# Patient Record
Sex: Male | Born: 1995 | Race: Black or African American | Hispanic: No | Marital: Single | Smoking: Never smoker
Health system: Southern US, Community
[De-identification: ages and names within clinical notes are randomized; demographics above are authoritative.]

## PROBLEM LIST (undated history)

## (undated) DIAGNOSIS — E669 Obesity, unspecified: Secondary | ICD-10-CM

## (undated) HISTORY — PX: KNEE SURGERY: SHX244

---

## 2018-02-27 ENCOUNTER — Other Ambulatory Visit: Payer: Self-pay

## 2018-02-27 ENCOUNTER — Ambulatory Visit (INDEPENDENT_AMBULATORY_CARE_PROVIDER_SITE_OTHER): Payer: Worker's Compensation

## 2018-02-27 ENCOUNTER — Ambulatory Visit
Admission: EM | Admit: 2018-02-27 | Discharge: 2018-02-27 | Disposition: A | Discharge status: Home / Self Care | Payer: Worker's Compensation | Attending: Emergency Medicine | Admitting: Emergency Medicine

## 2018-02-27 ENCOUNTER — Encounter: Payer: Self-pay | Admitting: Emergency Medicine

## 2018-02-27 DIAGNOSIS — X503XXA Overexertion from repetitive movements, initial encounter: Secondary | ICD-10-CM | POA: Diagnosis not present

## 2018-02-27 DIAGNOSIS — M7581 Other shoulder lesions, right shoulder: Secondary | ICD-10-CM | POA: Diagnosis not present

## 2018-02-27 DIAGNOSIS — M25511 Pain in right shoulder: Secondary | ICD-10-CM | POA: Diagnosis not present

## 2018-02-27 HISTORY — DX: Obesity, unspecified: E66.9

## 2018-02-27 MED ORDER — METHYLPREDNISOLONE 4 MG PO TBPK: ORAL_TABLET | Freq: Every day | ORAL | 0 refills | Status: DC

## 2018-02-27 MED ORDER — IBUPROFEN 600 MG PO TABS: 600.0000 mg | ORAL_TABLET | Freq: Four times a day (QID) | ORAL | 0 refills | Status: DC | PRN

## 2018-02-27 NOTE — ED Triage Notes (Signed)
Patient here today c/o right shoulder pain due to repetitive motion at work. Patient states the pain has been on going since Nov. 2019, but has gotten a lot worse since 02/16/18.

## 2018-02-27 NOTE — ED Provider Notes (Signed)
HPI  SUBJECTIVE:  Gary Mercado is a right-handed 23 y.o. male who presents with anterior/lateral right shoulder pain beginning in November 2010, states that he got worse 11 days ago.  He states it is intermittently sore, present only with forward flexion/extension and with lifting.  He denies any change in his physical activity.  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  He has had this job for the past year and a half.  He denies trauma to the shoulder, swelling, bruising, numbness or tingling distally.  No grip weakness.  No fevers.  He has tried a hand-held sander, heat, ice with improvement in his symptoms.  Symptoms are worse with forward flexion/extension and with lifting.  He does lift weights, but states that he has not increased his weight lifting recently.  Past medical history negative for diabetes, hypertension, right shoulder injury.  PMD: None.    Past Medical History:  Diagnosis Date  . Obesity     Past Surgical History:  Procedure Laterality Date  . KNEE SURGERY Right     Family History  Problem Relation Age of Onset  . Healthy Mother   . Healthy Father     Social History   Tobacco Use  . Smoking status: Never Smoker  . Smokeless tobacco: Never Used  Substance Use Topics  . Alcohol use: Yes    Comment: social  . Drug use: Never    No current facility-administered medications for this encounter.   Current Outpatient Medications:  .  ibuprofen (ADVIL,MOTRIN) 600 MG tablet, Take 1 tablet (600 mg total) by mouth every 6 (six) hours as needed., Disp: 30 tablet, Rfl: 0 .  methylPREDNISolone (MEDROL DOSEPAK) 4 MG TBPK tablet, Take by mouth daily. Follow package instructions, Disp: 21 tablet, Rfl: 0  No Known Allergies   ROS  As noted in HPI.   Physical Exam  BP 135/84 (BP Location: Left Arm)   Pulse 73   Temp 98.3 F (36.8 C) (Oral)   Resp 16   Ht 5\' 11"  (1.803 m)   Wt (!) 155.1  kg   SpO2 99%   BMI 47.70 kg/m   Constitutional: Well developed, well nourished, no acute distress Eyes:  EOMI, conjunctiva normal bilaterally HENT: Normocephalic, atraumatic,mucus membranes moist Respiratory: Normal inspiratory effort Cardiovascular: Normal rate GI: nondistended skin: No rash, skin intact Musculoskeletal: Shoulders symmetric.  Right shoulder with ROM normal, Drop test normal, clavicle NT, A/C joint NT , scapula NT, proximal humerus NT, shoulder joint NT , Motor strength normal, Sensation intact LT over deltoid region, distal NVI with hand on affected side having grossly intact sensation and strength.  Grip equal bilaterally. no pain with internal rotation, pain with external rotation, negative tenderness in bicipital groove, positive empty can test, negative liftoff test, negative "popeye" sign, no instability with abduction/external rotation. Neurologic: Alert & oriented x 3, no focal neuro deficits Psychiatric: Speech and behavior appropriate   ED Course   Medications - No data to display  Orders Placed This Encounter  Procedures  . DG Shoulder Right    Standing Status:   Standing    Number of Occurrences:   1    Order Specific Question:   Reason for Exam (SYMPTOM  OR DIAGNOSIS REQUIRED)    Answer:   pain since 02/16/18, repetitive use at work    No results found for this or any previous visit (from the past 24 hour(s)). Dg Shoulder Right  Result Date: 02/27/2018 CLINICAL DATA:  23 y/o M; anterior right shoulder pain radiating laterally. EXAM: RIGHT SHOULDER - 2+ VIEW COMPARISON:  None. FINDINGS: There is no evidence of fracture or dislocation. There is no evidence of arthropathy or other focal bone abnormality. Soft tissues are unremarkable. IMPRESSION: Negative. Electronically Signed   By: Mitzi Hansen M.D.   On: 02/27/2018 17:16    ED Clinical Impression  Rotator cuff tendonitis, right   ED Assessment/Plan  Reviewed imaging independently.   Normal shoulder.  See radiology report for full details.  Presentation consistent with a rotator cuff overuse syndrome/tendinopathy.  He has normal x-ray.  Will send home with ibuprofen 600 mg combined with 1 g of Tylenol 3-4 times a day as needed, continue massage, heat or ice, whichever feels better, Medrol Dosepak.  Our WC form filled out.  Will write for light duty for 1 week.  [facility] for reevaluation and referral to orthopedics if not better in a week.  Discussed  imaging, MDM, treatment plan, and plan for follow-up with patient.  patient agrees with plan.   Meds ordered this encounter  Medications  . ibuprofen (ADVIL,MOTRIN) 600 MG tablet    Sig: Take 1 tablet (600 mg total) by mouth every 6 (six) hours as needed.    Dispense:  30 tablet    Refill:  0  . methylPREDNISolone (MEDROL DOSEPAK) 4 MG TBPK tablet    Sig: Take by mouth daily. Follow package instructions    Dispense:  21 tablet    Refill:  0    *This clinic note was created using Dragon dictation software. Therefore, there may be occasional mistakes despite careful proofreading.   ?   Domenick Gong, MD 02/27/18 1749

## 2018-02-27 NOTE — Discharge Instructions (Addendum)
ibuprofen 600 mg combined with 1 g of Tylenol 3-4 times a day as needed, continue massage, heat or ice, whichever feels better, Medrol Dosepak. light duty for 1 week.  You will need to follow-up with [facility] employee health and wellness center for reevaluation and referral to orthopedics if not better in a week.

## 2019-11-04 IMAGING — CR DG SHOULDER 2+V*R*
3 series · 3 of 3 positions shown · non-contrast
Comparison: None.

CLINICAL DATA: 22 y/o M; anterior right shoulder pain radiating
laterally.

EXAM:
RIGHT SHOULDER - 2+ VIEW

[shoulder grashey]
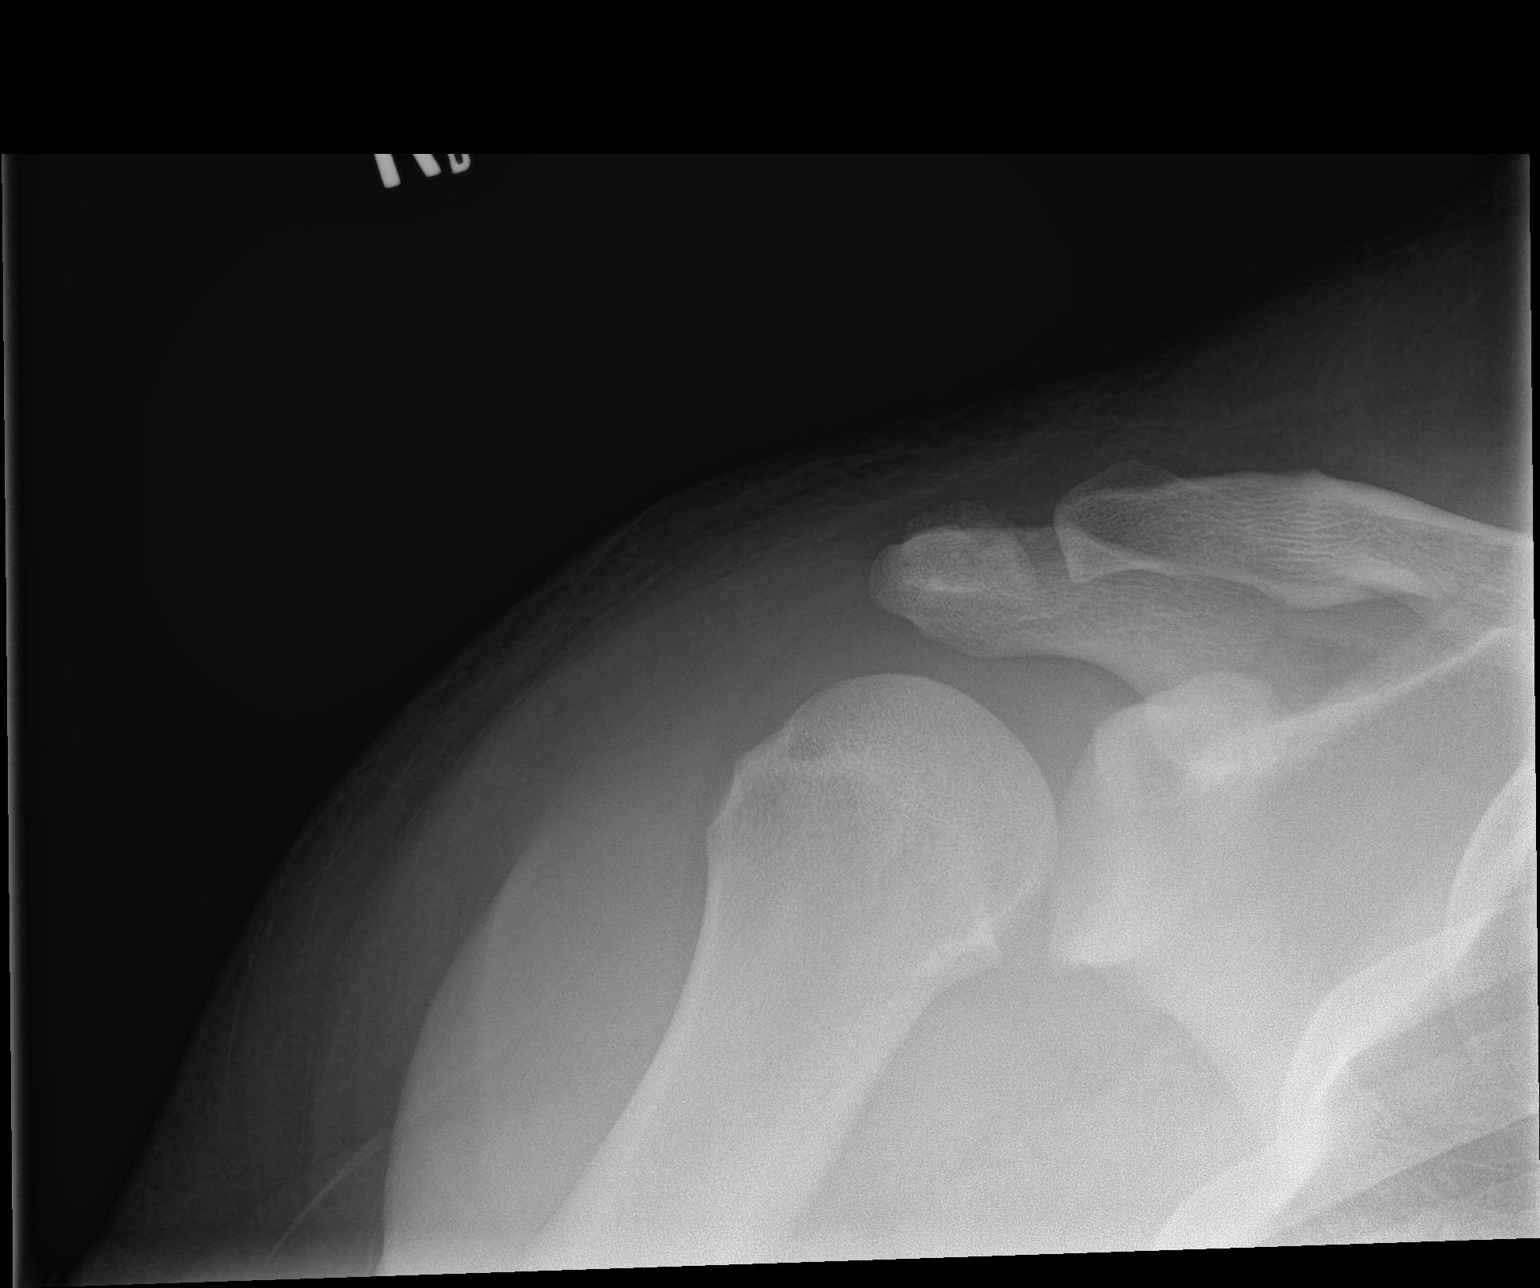

[shoulder y view]
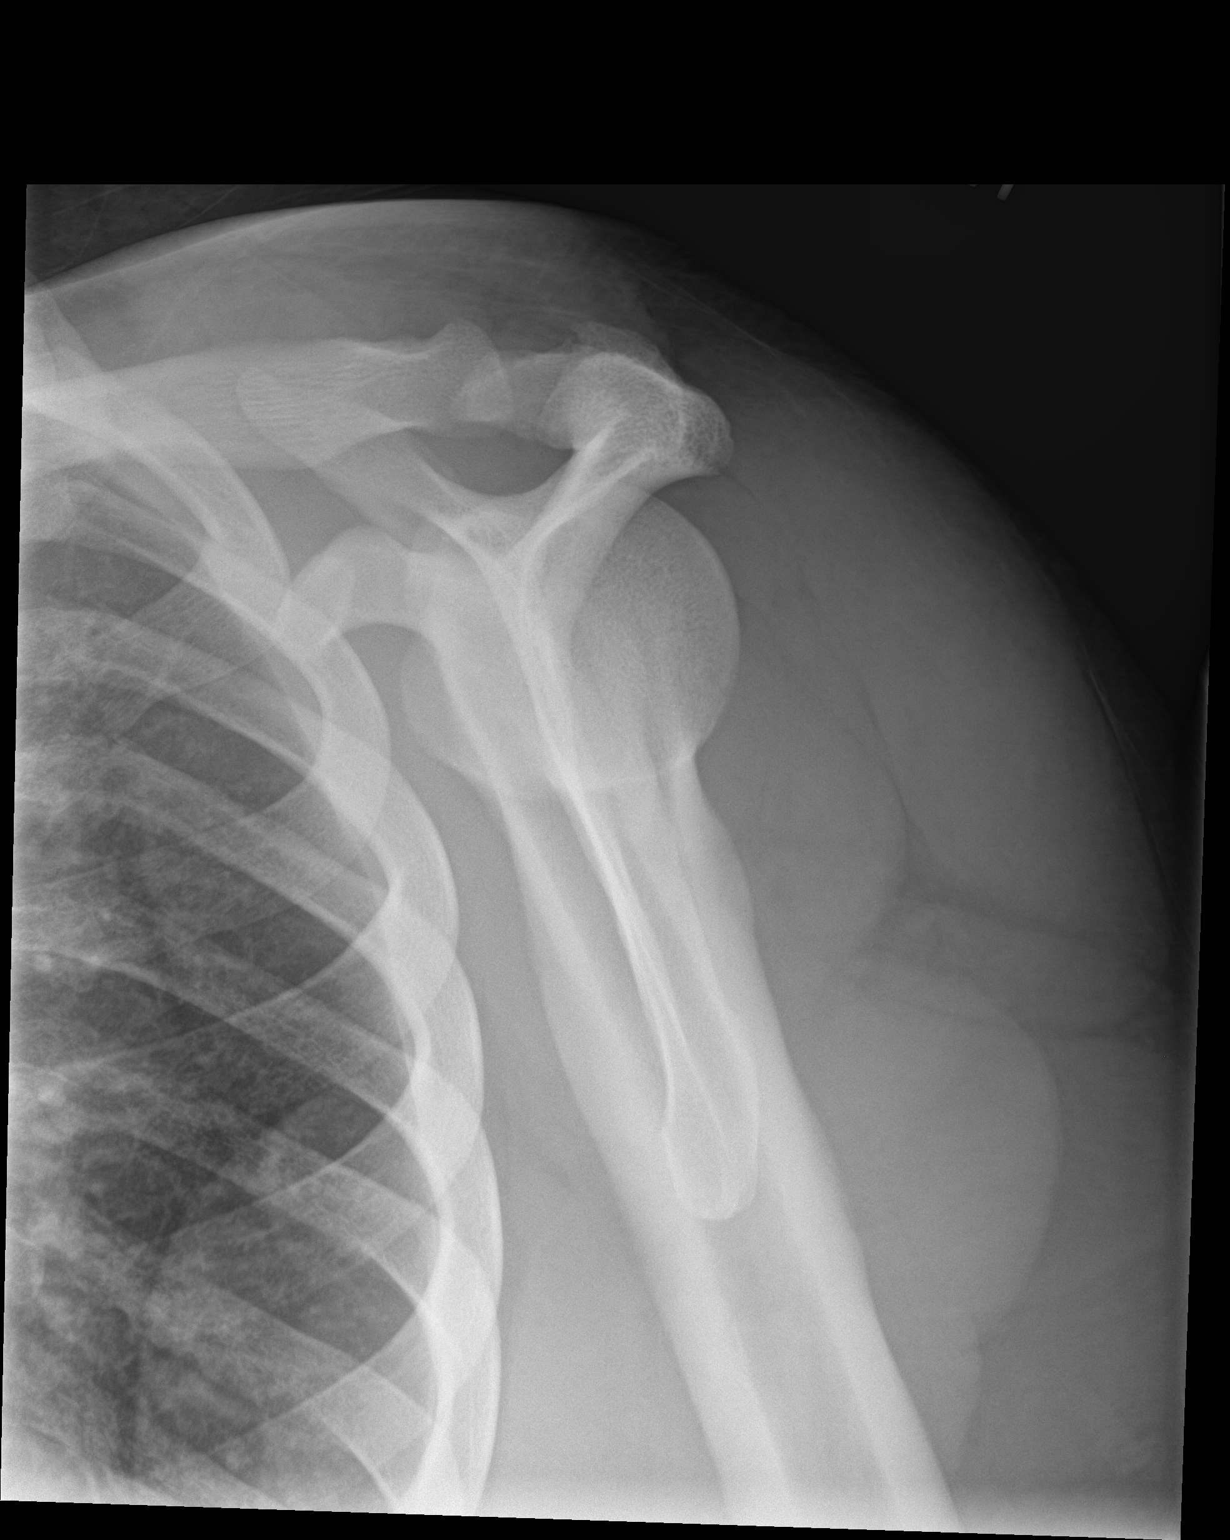

[shoulder axial]
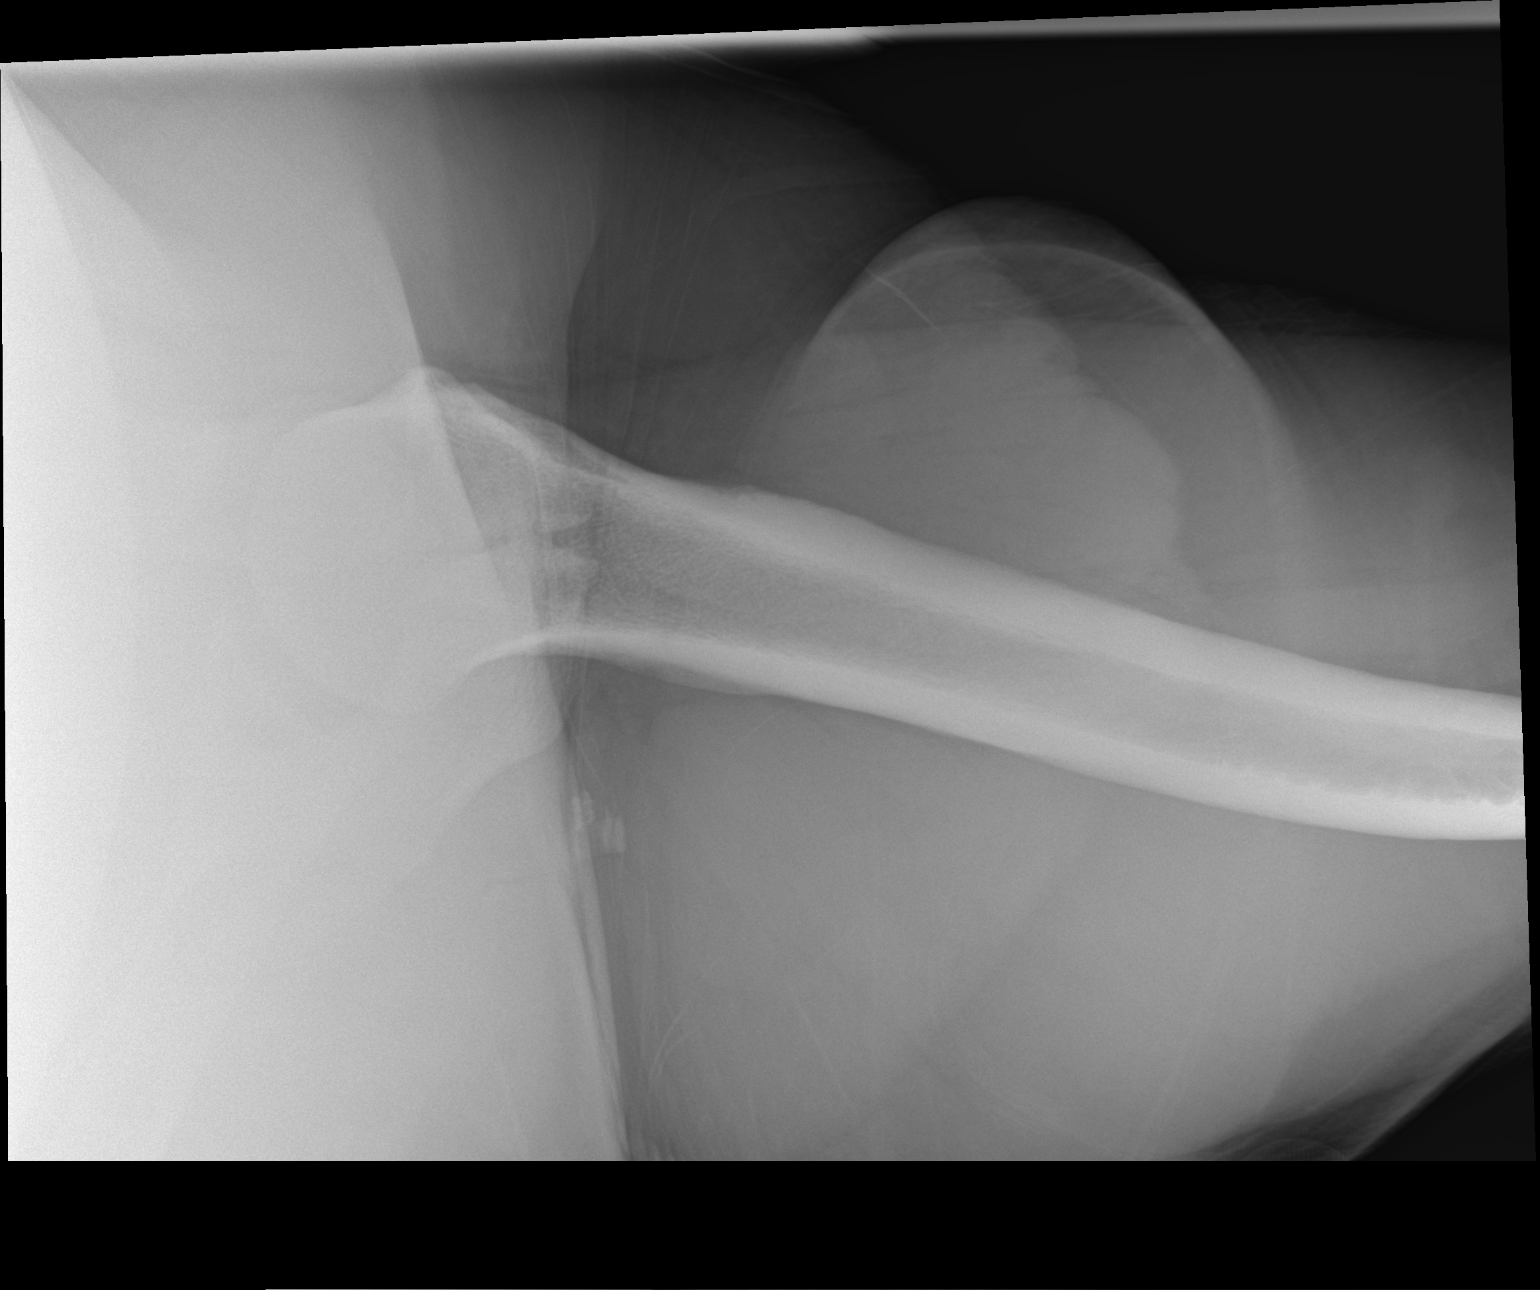

[3 of 3 positions shown; findings below may reference images not displayed]

FINDINGS: There is no evidence of fracture or dislocation. There is no
evidence of arthropathy or other focal bone abnormality. Soft
tissues are unremarkable.
IMPRESSION: Negative.

## 2023-09-30 ENCOUNTER — Ambulatory Visit (INDEPENDENT_AMBULATORY_CARE_PROVIDER_SITE_OTHER)

## 2023-09-30 ENCOUNTER — Other Ambulatory Visit: Payer: Self-pay

## 2023-09-30 ENCOUNTER — Inpatient Hospital Stay

## 2023-09-30 ENCOUNTER — Encounter: Payer: Self-pay | Admitting: Intensive Care

## 2023-09-30 ENCOUNTER — Observation Stay
Admission: EM | Admit: 2023-09-30 | Discharge: 2023-10-01 | Disposition: A | Discharge status: Home / Self Care | Attending: Student | Admitting: Student

## 2023-09-30 ENCOUNTER — Ambulatory Visit (INDEPENDENT_AMBULATORY_CARE_PROVIDER_SITE_OTHER)
Admission: EM | Admit: 2023-09-30 | Discharge: 2023-09-30 | Attending: Emergency Medicine | Admitting: Emergency Medicine

## 2023-09-30 DIAGNOSIS — E66813 Obesity, class 3: Secondary | ICD-10-CM | POA: Diagnosis not present

## 2023-09-30 DIAGNOSIS — L089 Local infection of the skin and subcutaneous tissue, unspecified: Secondary | ICD-10-CM | POA: Insufficient documentation

## 2023-09-30 DIAGNOSIS — E119 Type 2 diabetes mellitus without complications: Secondary | ICD-10-CM

## 2023-09-30 DIAGNOSIS — R718 Other abnormality of red blood cells: Secondary | ICD-10-CM | POA: Insufficient documentation

## 2023-09-30 DIAGNOSIS — R7401 Elevation of levels of liver transaminase levels: Secondary | ICD-10-CM | POA: Diagnosis not present

## 2023-09-30 DIAGNOSIS — E13621 Other specified diabetes mellitus with foot ulcer: Secondary | ICD-10-CM

## 2023-09-30 DIAGNOSIS — Z96651 Presence of right artificial knee joint: Secondary | ICD-10-CM | POA: Insufficient documentation

## 2023-09-30 DIAGNOSIS — R7989 Other specified abnormal findings of blood chemistry: Secondary | ICD-10-CM

## 2023-09-30 DIAGNOSIS — Z6841 Body Mass Index (BMI) 40.0 and over, adult: Secondary | ICD-10-CM | POA: Insufficient documentation

## 2023-09-30 DIAGNOSIS — L97509 Non-pressure chronic ulcer of other part of unspecified foot with unspecified severity: Secondary | ICD-10-CM | POA: Diagnosis not present

## 2023-09-30 DIAGNOSIS — E11621 Type 2 diabetes mellitus with foot ulcer: Secondary | ICD-10-CM | POA: Diagnosis present

## 2023-09-30 DIAGNOSIS — E139 Other specified diabetes mellitus without complications: Secondary | ICD-10-CM

## 2023-09-30 DIAGNOSIS — R7303 Prediabetes: Secondary | ICD-10-CM | POA: Insufficient documentation

## 2023-09-30 DIAGNOSIS — M869 Osteomyelitis, unspecified: Secondary | ICD-10-CM | POA: Insufficient documentation

## 2023-09-30 DIAGNOSIS — S91309A Unspecified open wound, unspecified foot, initial encounter: Principal | ICD-10-CM

## 2023-09-30 LAB — BASIC METABOLIC PANEL WITH GFR
Anion gap: 7 (ref 5–15)
BUN: 21 mg/dL — ABNORMAL HIGH (ref 6–20)
CO2: 25 mmol/L (ref 22–32)
Calcium: 9.1 mg/dL (ref 8.9–10.3)
Chloride: 101 mmol/L (ref 98–111)
Creatinine, Ser: 1.16 mg/dL (ref 0.61–1.24)
GFR, Estimated: >60 mL/min (ref >60)
Glucose, Bld: 86 mg/dL (ref 70–99)
Potassium: 4.1 mmol/L (ref 3.5–5.1)
Sodium: 133 mmol/L — ABNORMAL LOW (ref 135–145)

## 2023-09-30 LAB — CBC
HCT: 45.7 % (ref 39.0–52.0)
Hemoglobin: 14.1 g/dL (ref 13.0–17.0)
MCH: 22.8 pg — ABNORMAL LOW (ref 26.0–34.0)
MCHC: 30.9 g/dL (ref 30.0–36.0)
MCV: 73.8 fL — ABNORMAL LOW (ref 80.0–100.0)
Platelets: 269 K/uL (ref 150–400)
RBC: 6.19 MIL/uL — ABNORMAL HIGH (ref 4.22–5.81)
RDW: 17.7 % — ABNORMAL HIGH (ref 11.5–15.5)
WBC: 6.7 K/uL (ref 4.0–10.5)
nRBC: 0 % (ref 0.0–0.2)

## 2023-09-30 LAB — CBC WITH DIFFERENTIAL/PLATELET
Abs Immature Granulocytes: 0.04 K/uL (ref 0.00–0.07)
Basophils Absolute: 0 K/uL (ref 0.0–0.1)
Basophils Relative: 0 %
Eosinophils Absolute: 0.5 K/uL (ref 0.0–0.5)
Eosinophils Relative: 7 %
HCT: 46.7 % (ref 39.0–52.0)
Hemoglobin: 14.6 g/dL (ref 13.0–17.0)
Immature Granulocytes: 1 %
Lymphocytes Relative: 15 %
Lymphs Abs: 1.1 K/uL (ref 0.7–4.0)
MCH: 22.3 pg — ABNORMAL LOW (ref 26.0–34.0)
MCHC: 31.3 g/dL (ref 30.0–36.0)
MCV: 71.3 fL — ABNORMAL LOW (ref 80.0–100.0)
Monocytes Absolute: 0.7 K/uL (ref 0.1–1.0)
Monocytes Relative: 9 %
Neutro Abs: 4.8 K/uL (ref 1.7–7.7)
Neutrophils Relative %: 68 %
Platelets: 268 K/uL (ref 150–400)
RBC: 6.55 MIL/uL — ABNORMAL HIGH (ref 4.22–5.81)
RDW: 18.5 % — ABNORMAL HIGH (ref 11.5–15.5)
WBC: 7.2 K/uL (ref 4.0–10.5)
nRBC: 0 % (ref 0.0–0.2)

## 2023-09-30 LAB — CBG MONITORING, ED: Glucose-Capillary: 107 mg/dL — ABNORMAL HIGH (ref 70–99)

## 2023-09-30 LAB — CREATININE, SERUM
Creatinine, Ser: 1.23 mg/dL (ref 0.61–1.24)
GFR, Estimated: >60 mL/min (ref >60)

## 2023-09-30 LAB — SEDIMENTATION RATE: Sed Rate: 2 mm/h (ref 0–15)

## 2023-09-30 MED ORDER — ONDANSETRON HCL 4 MG PO TABS: 4.0000 mg | ORAL_TABLET | Freq: Four times a day (QID) | ORAL | Status: DC | PRN

## 2023-09-30 MED ORDER — ACETAMINOPHEN 325 MG PO TABS: 650.0000 mg | ORAL_TABLET | Freq: Four times a day (QID) | ORAL | Status: DC | PRN

## 2023-09-30 MED ORDER — PIPERACILLIN-TAZOBACTAM 3.375 G IVPB: 3.3750 g | Freq: Three times a day (TID) | INTRAVENOUS | Status: DC

## 2023-09-30 MED ORDER — ACETAMINOPHEN 650 MG RE SUPP: 650.0000 mg | Freq: Four times a day (QID) | RECTAL | Status: DC | PRN

## 2023-09-30 MED ORDER — INSULIN ASPART 100 UNIT/ML IJ SOLN: 0.0000 [IU] | Freq: Every day | INTRAMUSCULAR | Status: DC

## 2023-09-30 MED ORDER — VANCOMYCIN HCL 1750 MG/350ML IV SOLN
1750.0000 mg | Freq: Three times a day (TID) | INTRAVENOUS | Status: DC
  Administered 2023-10-01: 1750 mg via INTRAVENOUS
  Filled 2023-09-30 (×2): qty 350

## 2023-09-30 MED ORDER — VANCOMYCIN HCL IN DEXTROSE 1-5 GM/200ML-% IV SOLN
1000.0000 mg | Freq: Once | INTRAVENOUS | Status: AC
  Administered 2023-09-30: 1000 mg via INTRAVENOUS
  Filled 2023-09-30: qty 200

## 2023-09-30 MED ORDER — INSULIN ASPART 100 UNIT/ML IJ SOLN: 0.0000 [IU] | Freq: Three times a day (TID) | INTRAMUSCULAR | Status: DC

## 2023-09-30 MED ORDER — ONDANSETRON HCL 4 MG/2ML IJ SOLN: 4.0000 mg | Freq: Four times a day (QID) | INTRAMUSCULAR | Status: DC | PRN

## 2023-09-30 MED ORDER — MORPHINE SULFATE (PF) 2 MG/ML IV SOLN: 2.0000 mg | INTRAVENOUS | Status: DC | PRN

## 2023-09-30 MED ORDER — ENOXAPARIN SODIUM 40 MG/0.4ML IJ SOSY
40.0000 mg | PREFILLED_SYRINGE | INTRAMUSCULAR | Status: DC
Start: 2023-09-30 — End: 2023-09-30

## 2023-09-30 MED ORDER — ENOXAPARIN SODIUM 80 MG/0.8ML IJ SOSY: 0.5000 mg/kg | PREFILLED_SYRINGE | INTRAMUSCULAR | Status: DC

## 2023-09-30 MED ORDER — SODIUM CHLORIDE 0.9 % IV SOLN
2.0000 g | Freq: Three times a day (TID) | INTRAVENOUS | Status: DC
  Administered 2023-09-30 – 2023-10-01 (×2): 2 g via INTRAVENOUS
  Filled 2023-09-30 (×2): qty 12.5

## 2023-09-30 MED ORDER — SODIUM CHLORIDE 0.9 % IV SOLN: INTRAVENOUS | Status: DC

## 2023-09-30 MED ORDER — VANCOMYCIN HCL 1500 MG/300ML IV SOLN
1500.0000 mg | Freq: Once | INTRAVENOUS | Status: AC
  Administered 2023-09-30: 1500 mg via INTRAVENOUS
  Filled 2023-09-30: qty 300

## 2023-09-30 NOTE — ED Notes (Signed)
 Patient is being discharged from the Urgent Care and sent to the Emergency Department via POV . Per Dr.Mortenson, patient is in need of higher level of care due to osteomyelitis. Patient is aware and verbalizes understanding of plan of care.  Vitals:   09/30/23 1351  BP: (!) 145/91  Pulse: 83  Resp: 16  Temp: 98.3 F (36.8 C)  SpO2: 97%

## 2023-09-30 NOTE — ED Provider Notes (Addendum)
 HPI  SUBJECTIVE:  Gary Mercado is a 28 y.o. male who presents with 3 weeks of bilateral foot pain, infection.  He states the right is worse than the left.  He wears boots that have holes in them, and are damp for prolonged periods of time.  He states that the skin became raw, developed cracks in between his toes.  He reports burning pain, purulent odorous discharge and debris/buildup.  No itching, bleeding, erythema, swelling of the toes or foot, fevers.  No direct chemical exposure.  He has been applying Neosporin, hydrogen peroxide, athlete's foot powder, wrapping it without improvement in his symptoms.  No aggravating factors.  He has a past medical history of prediabetes.  No history of peripheral neuropathy, MRSA, chronic kidney disease.  PCP: None.   Past Medical History:  Diagnosis Date   Obesity     Past Surgical History:  Procedure Laterality Date   KNEE SURGERY Right     Family History  Problem Relation Age of Onset   Healthy Mother    Healthy Father     Social History   Tobacco Use   Smoking status: Never   Smokeless tobacco: Never  Vaping Use   Vaping status: Never Used  Substance Use Topics   Alcohol use: Yes    Comment: social   Drug use: Never    No current facility-administered medications for this encounter. No current outpatient medications on file.  No Known Allergies   ROS  As noted in HPI.   Physical Exam  BP (!) 145/91 (BP Location: Left Wrist)   Pulse 83   Temp 98.3 F (36.8 C) (Oral)   Resp 16   Wt (!) 147.6 kg   SpO2 97%   BMI 45.38 kg/m   Constitutional: Well developed, well nourished, no acute distress Eyes:  EOMI, conjunctiva normal bilaterally HENT: Normocephalic, atraumatic,mucus membranes moist Respiratory: Normal inspiratory effort Cardiovascular: Normal rate GI: nondistended skin: Extensive tender purulent drainage, maceration between 2nd and 3rd toes right foot       Extensive maceration, purulent drainage  in between 3rd and 4th toes of left foot.  Skin breakdown, maceration between all toes.           Musculoskeletal: no deformities Neurologic: Alert & oriented x 3, no focal neuro deficits Psychiatric: Speech and behavior appropriate   ED Course   Medications - No data to display  Orders Placed This Encounter  Procedures   Aerobic Culture w Gram Stain (superficial specimen)    Standing Status:   Standing    Number of Occurrences:   1   DG Foot Complete Left    Standing Status:   Standing    Number of Occurrences:   1    Reason for Exam (SYMPTOM  OR DIAGNOSIS REQUIRED):   Extensive purulent drainage between 4th and 5th toes, rule out osteomyelitis   DG Foot Complete Right    Standing Status:   Standing    Number of Occurrences:   1    Reason for Exam (SYMPTOM  OR DIAGNOSIS REQUIRED):   Maceration, infection between 3rd and 4th toes rule out an osteomyelitis, subcutaneous air   CBC with Differential    Standing Status:   Standing    Number of Occurrences:   1   Basic metabolic panel    Standing Status:   Standing    Number of Occurrences:   1   Nursing Communication Please set up with a PCP prior to discharge    Please  set up with a PCP prior to discharge    Standing Status:   Standing    Number of Occurrences:   1    Results for orders placed or performed during the hospital encounter of 09/30/23 (from the past 24 hours)  CBC with Differential     Status: Abnormal   Collection Time: 09/30/23  4:22 PM  Result Value Ref Range   WBC 7.2 4.0 - 10.5 K/uL   RBC 6.55 (H) 4.22 - 5.81 MIL/uL   Hemoglobin 14.6 13.0 - 17.0 g/dL   HCT 53.2 60.9 - 47.9 %   MCV 71.3 (L) 80.0 - 100.0 fL   MCH 22.3 (L) 26.0 - 34.0 pg   MCHC 31.3 30.0 - 36.0 g/dL   RDW 81.4 (H) 88.4 - 84.4 %   Platelets 268 150 - 400 K/uL   nRBC 0.0 0.0 - 0.2 %   Neutrophils Relative % 68 %   Neutro Abs 4.8 1.7 - 7.7 K/uL   Lymphocytes Relative 15 %   Lymphs Abs 1.1 0.7 - 4.0 K/uL   Monocytes Relative 9 %    Monocytes Absolute 0.7 0.1 - 1.0 K/uL   Eosinophils Relative 7 %   Eosinophils Absolute 0.5 0.0 - 0.5 K/uL   Basophils Relative 0 %   Basophils Absolute 0.0 0.0 - 0.1 K/uL   Immature Granulocytes 1 %   Abs Immature Granulocytes 0.04 0.00 - 0.07 K/uL  Basic metabolic panel     Status: Abnormal   Collection Time: 09/30/23  4:22 PM  Result Value Ref Range   Sodium 133 (L) 135 - 145 mmol/L   Potassium 4.1 3.5 - 5.1 mmol/L   Chloride 101 98 - 111 mmol/L   CO2 25 22 - 32 mmol/L   Glucose, Bld 86 70 - 99 mg/dL   BUN 21 (H) 6 - 20 mg/dL   Creatinine, Ser 8.83 0.61 - 1.24 mg/dL   Calcium 9.1 8.9 - 89.6 mg/dL   GFR, Estimated >39 >39 mL/min   Anion gap 7 5 - 15   DG Foot Complete Right Result Date: 09/30/2023 CLINICAL DATA:  Provided history: Maceration, infection between 3rd and 4th toes rule out an osteomyelitis, subcutaneous air EXAM: RIGHT FOOT COMPLETE - 3+ VIEW COMPARISON:  None Available. FINDINGS: No erosive or bony destructive change. No fracture. Mild talonavicular degenerative change. Mild degenerative change of the first metatarsal phalangeal joint. Soft tissue thickening of the toes. No soft tissue gas or radiopaque foreign body. Generalized soft tissue edema of the foot. IMPRESSION: Soft tissue thickening of the toes. No radiographic findings of osteomyelitis. No soft tissue gas. Electronically Signed   By: Andrea Gasman M.D.   On: 09/30/2023 16:30   DG Foot Complete Left Result Date: 09/30/2023 CLINICAL DATA:  Provided history: Extensive purulent drainage between 4th and 5th toes, rule out osteomyelitis EXAM: LEFT FOOT - COMPLETE 3+ VIEW COMPARISON:  None Available. FINDINGS: No obvious bony destructive change. There is subtle diminished density involving the distal tuft of the fourth digit which can be seen in the setting of early osteomyelitis or asymmetry of overlapping soft tissues. No soft tissue gas or radiopaque foreign body. No fracture. Mild talonavicular spurring. Large  os trigonum. Soft tissue edema seen of the forefoot, more prominently affecting the fourth toe. IMPRESSION: 1. Subtle diminished density involving the distal tuft of the fourth digit which can be seen in the setting of early osteomyelitis or asymmetry of overlapping soft tissues. No obvious bony destructive change. Consider MRI if  there is clinical concern for osteomyelitis. 2. Soft tissue edema of the forefoot, more prominently affecting the fourth toe. Electronically Signed   By: Andrea Gasman M.D.   On: 09/30/2023 16:29    ED Clinical Impression  1. Left foot infection   2. Right foot infection   3. 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  Will check CBC, BMP for kidney function and glucose as he is a prediabetic, bilateral foot x-ray to evaluate for osteomyelitis, aerobic wound culture.  Unable to collect fungal culture as we only have 1 wound culture swab left.  Reviewed imaging independently.  No subcutaneous air.  Subtle diminished density distal tuft of the fourth digit which could be early osteomyelitis.  See radiology report for full details.   No leukocytosis.  BMP pending at the time of initial signing of this note  BMP normal.  X-ray is concerning for osteomyelitis in the fourth toe.  Transferring to the emergency department for comprehensive evaluation and possibly specialty consultation if necessary.  He is stable to go by private vehicle.  Discussed labs, imaging, rationale for transfer to the emergency department with the patient.  He agrees to go.   No orders of the defined types were placed in this encounter.     *This clinic note was created using Dragon dictation software. Therefore, there may be occasional mistakes despite careful proofreading.  ?    Van Knee, MD 09/30/23 1647    Van Knee, MD 09/30/23 JOANIE    Van Knee, MD 09/30/23 1733

## 2023-09-30 NOTE — ED Notes (Signed)
 Patient transported to MRI

## 2023-09-30 NOTE — Discharge Instructions (Addendum)
 Your x-ray was read as concerning for osteomyelitis in your left fourth toe.  This is a bone infection, which is very serious.  This will not respond to oral antibiotics, but may need IV antibiotics.  I am sending you to the emergency department to rule out osteomyelitis and for further treatment and possible specialty evaluation if deemed necessary.  Your basic metabolic panel and CBC are pending.  I have sent off a wound culture.

## 2023-09-30 NOTE — Progress Notes (Signed)
 PHARMACIST - PHYSICIAN COMMUNICATION  CONCERNING:  Enoxaparin  (Lovenox ) for DVT Prophylaxis    RECOMMENDATION: Patient was prescribed enoxaprin 40mg  q24 hours for VTE prophylaxis.   Filed Weights   09/30/23 1737  Weight: (!) 149.7 kg (330 lb)    Body mass index is 44.76 kg/m.  Estimated Creatinine Clearance: 140.1 mL/min (by C-G formula based on SCr of 1.16 mg/dL).   Based on [facility] policy patient is candidate for enoxaparin  0.5mg /kg TBW SQ every 24 hours based on BMI being >30.  DESCRIPTION: Pharmacy has adjusted enoxaparin  dose per [facility] policy.  Patient is now receiving enoxaparin  75 mg every 24 hours    Lum VEAR Mania, PharmD Clinical Pharmacist  09/30/2023 7:32 PM

## 2023-09-30 NOTE — H&P (Signed)
 History and Physical    Patient: Gary Mercado FMW:[id number] DOB: [birth year] DOA: 09/30/2023 DOS: the patient was seen and examined on 09/30/2023 PCP: Patient, No Pcp Per  Patient coming from: Home  Chief Complaint:  Chief Complaint  Patient presents with   Osteomyelitis    HPI: Gary Mercado is a 28 y.o. 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  Patient described the work condition as a damp and wet area.  Patient noticed a crack in his toes about 3 weeks ago which has gradually gotten worse involving the 3rd and 4th toe.  It is now painful oozing and then started to change color.  Came to the ER where he appears to have infected wounds involving the 3rd and 4th toes of both feet.  There is concern for possible osteomyelitis.  Patient has been admitted for IV antibiotics as well as podiatrist evaluation.  Review of Systems: As mentioned in the history of present illness. All other systems reviewed and are negative. Past Medical History:  Diagnosis Date   Obesity    Past Surgical History:  Procedure Laterality Date   KNEE SURGERY Right    Social History:  reports that he has never smoked. He has never used smokeless tobacco. He reports current alcohol use. He reports that he does not use drugs.  No Known Allergies  Family History  Problem Relation Age of Onset   Healthy Mother    Healthy Father     Prior to Admission medications   Not on File    Physical Exam: Vitals:   09/30/23 1736 09/30/23 1737  BP: (!) 157/77   Pulse: 89   Resp: 16   Temp: 98.6 F (37 C)   TempSrc: Oral   SpO2: 99%   Weight:  (!) 149.7 kg  Height:  6' (1.829 m)   Constitutional: Morbidly obese, acutely NAD, calm, comfortable Eyes: PERRL, lids and conjunctivae normal ENMT: Mucous membranes are moist. Posterior pharynx clear of any exudate or  lesions.Normal dentition.  Neck: normal, supple, no masses, no thyromegaly Respiratory: clear to auscultation bilaterally, no wheezing, no crackles. Normal respiratory effort. No accessory muscle use.  Cardiovascular: Regular rate and rhythm, no murmurs / rubs / gallops. No extremity edema. 2+ pedal pulses. No carotid bruits.  Abdomen: no tenderness, no masses palpated. No hepatosplenomegaly. Bowel sounds positive.  Musculoskeletal: Good range of motion, no joint swelling or tenderness, Skin: Ulcerated 3rd and 4th toes of both feet, obvious color change oozing some pus Neurologic: CN 2-12 grossly intact. Sensation intact, DTR normal. Strength 5/5 in all 4.  Psychiatric: Normal judgment and insight. Alert and oriented x 3. Normal mood  Data Reviewed:  Temperature 98.6, blood pressure 157/77, pulse 89 respiratory 16, white count 6.7 hemoglobin 14.1, BUN is 21 creatinine 1.23 sodium 133 glucose 107  Assessment and Plan:  #1 bilateral ulcers of the feet: Patient will be admitted.  Will initiate IV vancomycin  and cefepime .  Will get podiatry consult in the morning.  Wound care.  MRI will be done and if osteomyelitis is confirmed would likely need surgical intervention including with podiatry.  #2 prediabetes: Patient has A1c of 6.2 apparently.  This was a couple of weeks ago.  We will check another A1c statin sliding scale insulin  if needed.  #3 morbid obesity: Dietary counseling  #4 hyponatremia: Continue to monitor sodium.    Advance Care  Planning:   Code Status: Full Code   Consults: Will consult podiatry in the morning  Family Communication: No family at bedside  Severity of Illness: The appropriate patient status for this patient is INPATIENT. Inpatient status is judged to be reasonable and necessary in order to provide the required intensity of service to ensure the patient's safety. The patient's presenting symptoms, physical exam findings, and initial radiographic and laboratory  data in the context of their chronic comorbidities is felt to place them at high risk for further clinical deterioration. Furthermore, it is not anticipated that the patient will be medically stable for discharge from the hospital within 2 midnights of admission.   * I certify that at the point of admission it is my clinical judgment that the patient will require inpatient hospital care spanning beyond 2 midnights from the point of admission due to high intensity of service, high risk for further deterioration and high frequency of surveillance required.*  AuthorBETHA SIM KNOLL, MD 09/30/2023 7:28 PM  For on call review [email or web address].

## 2023-09-30 NOTE — ED Triage Notes (Signed)
 Pt c/o cracks in between bilateral toes x3 wks. States works at [employer] his boots have holes on the top & concerned that chemicals got in between toes. Has tried OTC creams w/o relief.

## 2023-09-30 NOTE — ED Provider Notes (Signed)
 [facility] Provider Note    Event Date/Time   First MD Initiated Contact with Patient 09/30/23 1836     (approximate)   History   Osteomyelitis    HPI  Gary Mercado is a 28 y.o. male history of prediabetes who was sent in from urgent care for concerns for osteomyelitis and wound infection to his bilateral feet.  States that for the past 3 weeks he has had holes to his work boots and has had water to seep into them.  States that they have been left wet while he has been working.  Noticed wounds develop and now having purulent drainage and some erythema between his bilateral 3rd and 4th digits.  He denies any fevers.  States no overt tenderness, no numbness.  No trauma.  On independent chart review, he was sent in by urgent care for concerns of osteomyelitis, they got labs that did not show a leukocytosis, mild hyponatremia without AKI, glucose was 86.     Physical Exam   Triage Vital Signs: ED Triage Vitals  Encounter Vitals Group     BP 09/30/23 1736 (!) 157/77     Girls Systolic BP Percentile --      Girls Diastolic BP Percentile --      Boys Systolic BP Percentile --      Boys Diastolic BP Percentile --      Pulse Rate 09/30/23 1736 89     Resp 09/30/23 1736 16     Temp 09/30/23 1736 98.6 F (37 C)     Temp Source 09/30/23 1736 Oral     SpO2 09/30/23 1736 99 %     Weight 09/30/23 1737 (!) 330 lb (149.7 kg)     Height 09/30/23 1737 6' (1.829 m)     Head Circumference --      Peak Flow --      Pain Score 09/30/23 1736 6     Pain Loc --      Pain Education --      Exclude from Growth Chart --     Most recent vital signs: Vitals:   09/30/23 1736  BP: (!) 157/77  Pulse: 89  Resp: 16  Temp: 98.6 F (37 C)  SpO2: 99%     General: Awake, no distress.  CV:  Good peripheral perfusion.  Resp:  Normal effort.  Abd:  No distention.  Other:  No unilateral calf swing or tenderness, he does have wounds to his bilateral toes between  his 3rd and 4th digits, there is purulent drainage, I do not see obvious bone, there is mild surrounding erythema.  No focal sensory or other deficits, palpable DP pulses bilaterally.   ED Results / Procedures / Treatments   Labs (all labs ordered are listed, but only abnormal results are displayed) Labs Reviewed  CULTURE, BLOOD (ROUTINE X 2)  CULTURE, BLOOD (ROUTINE X 2)  SEDIMENTATION RATE  C-REACTIVE PROTEIN      PROCEDURES:  Critical Care performed: No  Procedures   MEDICATIONS ORDERED IN ED: Medications  vancomycin  (VANCOCIN ) IVPB 1000 mg/200 mL premix (has no administration in time range)    And  vancomycin  (VANCOREADY) IVPB 1500 mg/300 mL (has no administration in time range)  piperacillin -tazobactam (ZOSYN ) IVPB 3.375 g (has no administration in time range)  vancomycin  (VANCOREADY) IVPB 1750 mg/350 mL (has no administration in time range)     IMPRESSION / MDM / ASSESSMENT AND PLAN / ED COURSE  I reviewed the triage vital signs  and the nursing notes.                              Differential diagnosis includes, but is not limited to, wound infection, pressure ulcer infection, osteomyelitis.  Basic labs were obtained at urgent care, will add blood cultures, ESR, CRP.  Will also start him on IV antibiotics and plan to have him admitted.  Patient's presentation is most consistent with acute presentation with potential threat to life or bodily function.  Consulted hospitalist was agreeable with the plan for admission and will evaluate the patient.  He is admitted.       FINAL CLINICAL IMPRESSION(S) / ED DIAGNOSES   Final diagnoses:  Multiple open wounds of foot     Rx / DC Orders   ED Discharge Orders     None        Note:  This document was prepared using Dragon voice recognition software and may include unintentional dictation errors.    Waymond Lorelle Cummins, MD 09/30/23 [phone]

## 2023-09-30 NOTE — ED Triage Notes (Addendum)
 Patient had xray today at [facility] in [place] and sent to ER for possible osteomyelitis in right foot, toes. Area is red and leaking yellow puss.  Wound culture was sent at [facility] and blood drawn

## 2023-09-30 NOTE — ED Notes (Signed)
Returned to ED.

## 2023-09-30 NOTE — Progress Notes (Addendum)
 Pharmacy Antibiotic Note  Gary Mercado is a 28 y.o. male admitted on 09/30/2023 with wound infection.  Pharmacy has been consulted for vancomycin  and cefepime  dosing.  Plan: Give vancomycin  2500 mg IV x1 followed by 1750 mg IV Q8H. Goal AUC 400-550. Expected AUC: 417.4 Expected Css min: 11.5 SCr used: 1.16  Weight used: IBW, Vd used: 0.5 (BMI 44.76) Start cefepime  2g IV Q8H Continue to monitor renal function and follow culture results  Height: 6' (182.9 cm) Weight: (!) 149.7 kg (330 lb) IBW/kg (Calculated) : 77.6  Temp (24hrs), Avg:98.5 F (36.9 C), Min:98.3 F (36.8 C), Max:98.6 F (37 C)  Recent Labs  Lab 09/30/23 1622  WBC 7.2  CREATININE 1.16    Estimated Creatinine Clearance: 140.1 mL/min (by C-G formula based on SCr of 1.16 mg/dL).    No Known Allergies  Antimicrobials this admission: 9/16 Vanc >>  9/16 Zosyn  >>   Microbiology results: 9/16 wound culture: IP  Thank you for allowing pharmacy to be a part of this patient's care.  Lum VEAR Mania, PharmD, BCPS 09/30/2023 7:05 PM

## 2023-10-01 ENCOUNTER — Other Ambulatory Visit: Payer: Self-pay

## 2023-10-01 DIAGNOSIS — L97521 Non-pressure chronic ulcer of other part of left foot limited to breakdown of skin: Secondary | ICD-10-CM | POA: Diagnosis not present

## 2023-10-01 DIAGNOSIS — L97511 Non-pressure chronic ulcer of other part of right foot limited to breakdown of skin: Secondary | ICD-10-CM

## 2023-10-01 LAB — CBC
HCT: 47.9 % (ref 39.0–52.0)
Hemoglobin: 14.6 g/dL (ref 13.0–17.0)
MCH: 22.3 pg — ABNORMAL LOW (ref 26.0–34.0)
MCHC: 30.5 g/dL (ref 30.0–36.0)
MCV: 73.1 fL — ABNORMAL LOW (ref 80.0–100.0)
Platelets: 279 K/uL (ref 150–400)
RBC: 6.55 MIL/uL — ABNORMAL HIGH (ref 4.22–5.81)
RDW: 17.9 % — ABNORMAL HIGH (ref 11.5–15.5)
WBC: 6.6 K/uL (ref 4.0–10.5)
nRBC: 0 % (ref 0.0–0.2)

## 2023-10-01 LAB — COMPREHENSIVE METABOLIC PANEL WITH GFR
ALT: 70 U/L — ABNORMAL HIGH (ref 0–44)
AST: 60 U/L — ABNORMAL HIGH (ref 15–41)
Albumin: 3.4 g/dL — ABNORMAL LOW (ref 3.5–5.0)
Alkaline Phosphatase: 42 U/L (ref 38–126)
Anion gap: 6 (ref 5–15)
BUN: 17 mg/dL (ref 6–20)
CO2: 26 mmol/L (ref 22–32)
Calcium: 9.1 mg/dL (ref 8.9–10.3)
Chloride: 104 mmol/L (ref 98–111)
Creatinine, Ser: 1.11 mg/dL (ref 0.61–1.24)
GFR, Estimated: >60 mL/min (ref >60)
Glucose, Bld: 106 mg/dL — ABNORMAL HIGH (ref 70–99)
Potassium: 4.1 mmol/L (ref 3.5–5.1)
Sodium: 136 mmol/L (ref 135–145)
Total Bilirubin: 1.9 mg/dL — ABNORMAL HIGH (ref 0.0–1.2)
Total Protein: 8.5 g/dL — ABNORMAL HIGH (ref 6.5–8.1)

## 2023-10-01 LAB — CBG MONITORING, ED: Glucose-Capillary: 96 mg/dL (ref 70–99)

## 2023-10-01 LAB — HEMOGLOBIN A1C
Hgb A1c MFr Bld: 5.9 % — ABNORMAL HIGH (ref 4.8–5.6)
Mean Plasma Glucose: 122.63 mg/dL

## 2023-10-01 LAB — C-REACTIVE PROTEIN: CRP: 0.8 mg/dL (ref <1.0)

## 2023-10-01 LAB — HIV ANTIBODY (ROUTINE TESTING W REFLEX): HIV Screen 4th Generation wRfx: NONREACTIVE

## 2023-10-01 MED ORDER — ACETAMINOPHEN 325 MG PO TABS: 650.0000 mg | ORAL_TABLET | Freq: Four times a day (QID) | ORAL | Status: AC | PRN

## 2023-10-01 MED ORDER — VANCOMYCIN HCL 1500 MG/300ML IV SOLN
1500.0000 mg | Freq: Two times a day (BID) | INTRAVENOUS | Status: DC
  Administered 2023-10-01: 1500 mg via INTRAVENOUS
  Filled 2023-10-01 (×2): qty 300

## 2023-10-01 MED ORDER — SODIUM CHLORIDE 0.9 % IV SOLN
2.0000 g | INTRAVENOUS | Status: DC
  Administered 2023-10-01: 2 g via INTRAVENOUS
  Filled 2023-10-01: qty 20

## 2023-10-01 MED ORDER — CEFADROXIL 500 MG PO CAPS
1000.0000 mg | ORAL_CAPSULE | Freq: Every day | ORAL | 0 refills | Status: DC
  Filled 2023-10-01: qty 14, 7d supply, fill #0

## 2023-10-01 MED ORDER — VANCOMYCIN HCL 1750 MG/350ML IV SOLN: 1750.0000 mg | Freq: Two times a day (BID) | INTRAVENOUS | Status: DC

## 2023-10-01 MED ORDER — SODIUM CHLORIDE 0.9 % IV SOLN: 1.0000 g | INTRAVENOUS | Status: DC

## 2023-10-01 MED ORDER — CEFADROXIL 500 MG PO CAPS: 1000.0000 mg | ORAL_CAPSULE | Freq: Every day | ORAL | 0 refills | Status: AC

## 2023-10-01 NOTE — Progress Notes (Signed)
 1624 D/C AVS completed and reviewed with pt. All opportunities for questions answered and clarified. IV removed. Pt will be wheeled down to car at medical mall entrance via wheelchair.

## 2023-10-01 NOTE — ED Notes (Signed)
 Pt ambulatory to bathroom

## 2023-10-01 NOTE — ED Notes (Signed)
Pt given breakfast meal

## 2023-10-01 NOTE — Discharge Instructions (Addendum)
 Please take your antibiotics as prescribed and make sure that you perform wound care to your toes as noted below.    Apply Betadine to affected webspaces bilaterally.  Pat dry.  Keep gauze between the toes and allowed to dry.  Perform daily.

## 2023-10-01 NOTE — Progress Notes (Signed)
 Pharmacy Antibiotic Note  Gary Mercado is a 28 y.o. male admitted on 09/30/2023 with wound infectionof 3rd and 4th toes of both feet. PMH includes prediabetes, obesity. 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 Left foot XR reveals density involving the distal tuft of the fourth digit concerning for early osteomyelitis. Follow up MRI has been performed with interpretation in process. Pharmacy has been consulted for vancomycin .  Patient also receiving cefepime .  Plan: Adjust vancomycin  to 1500mg  IV q12H eAUC 475, Cmax 31.5, Cmin 13.0 Scr 1.23, IBW, Vd 0.5 L/kg (BMI 44.76) Continue to monitor renal function and follow culture results  Height: 6' (182.9 cm) Weight: (!) 149.7 kg (330 lb) IBW/kg (Calculated) : 77.6  Temp (24hrs), Avg:98.8 F (37.1 C), Min:98.3 F (36.8 C), Max:99.5 F (37.5 C)  Recent Labs  Lab 09/30/23 1622 09/30/23 1911  WBC 7.2 6.7  CREATININE 1.16 1.23    Estimated Creatinine Clearance: 132.2 mL/min (by C-G formula based on SCr of 1.23 mg/dL).    No Known Allergies  Antimicrobials this admission: 9/16 Vanc >>  9/16 Zosyn  >>   Microbiology results: 9/16 wound culture: pending -- few GPCs, rare GVRs so far  Thank you for allowing pharmacy to be a part of this patient's care.  Will M. Lenon, PharmD, BCPS Clinical Pharmacist 10/01/2023 7:54 AM

## 2023-10-01 NOTE — Consult Note (Signed)
 ORTHOPAEDIC CONSULTATION  REQUESTING PHYSICIAN: Franchot Novel, MD  Chief Complaint: Bilateral foot infection  HPI: Gary Mercado is a 28 y.o. male who complains of worsening wounds to bilateral feet.  He states over the last few weeks he has noticed wounds between the toes on bilateral feet.  On the left third webspace and right second webspace he has had draining wounds.  He states he has had a lot of moisture getting into his shoes at work.  He was seen in urgent care yesterday there was an area of concern on x-ray for possible osteomyelitis admitted to the hospital for further workup.  Past Medical History:  Diagnosis Date   Obesity    Past Surgical History:  Procedure Laterality Date   KNEE SURGERY Right    Social History   Socioeconomic History   Marital status: Single    Spouse name: Not on file   Number of children: Not on file   Years of education: Not on file   Highest education level: Not on file  Occupational History   Not on file  Tobacco Use   Smoking status: Never   Smokeless tobacco: Never  Vaping Use   Vaping status: Never Used  Substance and Sexual Activity   Alcohol use: Yes    Comment: social   Drug use: Never   Sexual activity: Not on file  Other Topics Concern   Not on file  Social History Narrative   Not on file   Social Drivers of Health   Financial Resource Strain: Not on file  Food Insecurity: No Food Insecurity (10/01/2023)   Hunger Vital Sign    Worried About Running Out of Food in the Last Year: Never true    Ran Out of Food in the Last Year: Never true  Transportation Needs: No Transportation Needs (10/01/2023)   PRAPARE - Administrator, Civil Service (Medical): No    Lack of Transportation (Non-Medical): No  Physical Activity: Not on file  Stress: Not on file  Social Connections: Not on file   Family History  Problem Relation Age of Onset   Healthy Mother    Healthy Father    No Known Allergies Prior to  Admission medications   Not on File   MR FOOT LEFT WO CONTRAST Result Date: 10/01/2023 CLINICAL DATA:  Wounds between the toes over the last 3 weeks EXAM: MRI OF THE LEFT FOOT WITHOUT CONTRAST TECHNIQUE: Multiplanar, multisequence MR imaging of the left foot from the midfoot through the toes was performed. No intravenous contrast was administered. COMPARISON:  09/30/2023 radiographs FINDINGS: Bones/Joint/Cartilage Low-level marrow edema inferiorly in the navicular noted near the attachment sites of the inferoplantar longitudinal portion of the spring ligament and near the attachment site of the plantar cuneonavicular ligament. This could reflect low-grade stress injury. Ligaments The Lisfranc ligament appears intact. Muscles and Tendons Trace edema along the myotendinous junction of the abductor hallucis muscle, muscle strain not excluded. Soft tissues Dorsal subcutaneous edema in the forefoot, cellulitis is not excluded. Cutaneous thickening and likely inflammation along the web space between the third and fourth toes. IMPRESSION: 1. Cutaneous thickening and likely inflammation along the web space between the third and fourth toes. 2. Dorsal subcutaneous edema in the forefoot, cellulitis is not excluded. 3. Low-level marrow edema inferiorly in the navicular near the attachment sites of the inferoplantar longitudinal portion of the spring ligament and near the attachment site of the plantar cuneonavicular ligament. This could reflect low-grade stress injury. 4. Trace edema  along the myotendinous junction of the abductor hallucis muscle, muscle strain not excluded. Electronically Signed   By: Ryan Salvage M.D.   On: 10/01/2023 09:18   MR FOOT RIGHT WO CONTRAST Result Date: 10/01/2023 CLINICAL DATA:  Wounds between toes.  Assessment for osteomyelitis EXAM: MRI OF THE RIGHT FOREFOOT WITHOUT CONTRAST TECHNIQUE: Multiplanar, multisequence MR imaging of the right forefoot was performed. No intravenous contrast  was administered. COMPARISON:  Radiographs 09/30/2023 FINDINGS: Bones/Joint/Cartilage Abnormal edema noted proximally in the proximal phalanx of the great toe and in the medial sesamoid of the great toe. Slight irregularity of the plantar plate in this vicinity raises the possibility of turf toe, but given the clinical scenario, osteomyelitis is not excluded. Low-grade edema noted in the cuboid and lateral cuneiform, nonspecific but potentially from stress injury or arthropathy. Subtle endosteal edema at the talonavicular articulation likely secondary to arthropathy. Ligaments Lisfranc ligament intact. Muscles and Tendons Unremarkable Soft tissues Substantial abnormal cutaneous thickening/blistering between the second and third toes. Dorsal subcutaneous edema in the forefoot is nonspecific, cellulitis is not excluded. IMPRESSION: 1. Abnormal edema in the proximal phalanx of the great toe and in the medial sesamoid of the great toe. Slight irregularity of the plantar plate in this vicinity raises the possibility of turf toe, but given the clinical scenario, osteomyelitis is not excluded. 2. Substantial abnormal cutaneous thickening/blistering between the second and third toes. 3. Dorsal subcutaneous edema in the forefoot is nonspecific, cellulitis is not excluded. 4. Low-grade edema in the cuboid and lateral cuneiform, nonspecific but potentially from stress injury or arthropathy. Electronically Signed   By: Ryan Salvage M.D.   On: 10/01/2023 08:46   DG Foot Complete Right Result Date: 09/30/2023 CLINICAL DATA:  Provided history: Maceration, infection between 3rd and 4th toes rule out an osteomyelitis, subcutaneous air EXAM: RIGHT FOOT COMPLETE - 3+ VIEW COMPARISON:  None Available. FINDINGS: No erosive or bony destructive change. No fracture. Mild talonavicular degenerative change. Mild degenerative change of the first metatarsal phalangeal joint. Soft tissue thickening of the toes. No soft tissue gas or  radiopaque foreign body. Generalized soft tissue edema of the foot. IMPRESSION: Soft tissue thickening of the toes. No radiographic findings of osteomyelitis. No soft tissue gas. Electronically Signed   By: Andrea Gasman M.D.   On: 09/30/2023 16:30   DG Foot Complete Left Result Date: 09/30/2023 CLINICAL DATA:  Provided history: Extensive purulent drainage between 4th and 5th toes, rule out osteomyelitis EXAM: LEFT FOOT - COMPLETE 3+ VIEW COMPARISON:  None Available. FINDINGS: No obvious bony destructive change. There is subtle diminished density involving the distal tuft of the fourth digit which can be seen in the setting of early osteomyelitis or asymmetry of overlapping soft tissues. No soft tissue gas or radiopaque foreign body. No fracture. Mild talonavicular spurring. Large os trigonum. Soft tissue edema seen of the forefoot, more prominently affecting the fourth toe. IMPRESSION: 1. Subtle diminished density involving the distal tuft of the fourth digit which can be seen in the setting of early osteomyelitis or asymmetry of overlapping soft tissues. No obvious bony destructive change. Consider MRI if there is clinical concern for osteomyelitis. 2. Soft tissue edema of the forefoot, more prominently affecting the fourth toe. Electronically Signed   By: Andrea Gasman M.D.   On: 09/30/2023 16:29    Positive ROS: All other systems have been reviewed and were otherwise negative with the exception of those mentioned in the HPI and as above.  12 point ROS was performed.  Physical  Exam: General: Alert and oriented.  No apparent distress.  Vascular:  Left foot:Dorsalis Pedis:  present Posterior Tibial:  present  Right foot: Dorsalis Pedis:  present Posterior Tibial:  present  Neuro:intact  Derm: Between the left 3rd and 4th toes and right 2nd and 3rd toes there are macerated wounds.  Mild odor is noted.  No deep penetration.  No purulence noted today.  Mild erythema to the toes.  Ortho/MS:  Mild edema to the left 3rd and 4th toes and mild edema to the right 2nd and 3rd toes.  I personally reviewed both x-rays and MRIs of the bilateral feet.   The area of concern on the very distal aspect of the left fourth toe as pointed out on the x-ray does not show any obvious erosive change.  MRI is negative for osteomyelitis. X-rays of the right foot are also benign as well as MRIs been done.    Assessment: Superficial soft tissue infection interdigital bilateral feet  Plan: These look to be superficial in nature.  My personal review of x-rays and radiology review of MRI is negative for osteomyelitis.  There is no deep wound or penetrating ulceration at this time.  My recommendation is consistent wound care to these areas.  Paint each site with Betadine.  Pat dry and keep gauze between the toes.  Okay to cleanse regularly with soap and water as well.  I would recommend p.o. antibiotics broad-spectrum I would recommend Duricef at this time.  Patient should follow-up in outpatient clinic with me early next week.  I will place wound care instructions in the discharge instructions.  Podiatry standpoint patient should be stable for discharge.    Ashley Eva LABOR, DPM Cell [phone]   10/01/2023 2:29 PM

## 2023-10-02 ENCOUNTER — Other Ambulatory Visit: Payer: Self-pay | Admitting: Student

## 2023-10-02 DIAGNOSIS — R718 Other abnormality of red blood cells: Secondary | ICD-10-CM

## 2023-10-02 DIAGNOSIS — R7989 Other specified abnormal findings of blood chemistry: Secondary | ICD-10-CM

## 2023-10-02 DIAGNOSIS — L97501 Non-pressure chronic ulcer of other part of unspecified foot limited to breakdown of skin: Secondary | ICD-10-CM

## 2023-10-02 DIAGNOSIS — R7303 Prediabetes: Secondary | ICD-10-CM

## 2023-10-02 NOTE — Discharge Summary (Signed)
 Physician Discharge Summary   Patient: Gary Mercado MRN: [id number] DOB: [birth year]  Admit date:     09/30/2023  Discharge date: 10/01/2023  Discharge Physician: Alban Pepper   PCP: Patient, No Pcp Per   Recommendations at discharge:    Will need microcytosis worked up outpatient Will need LFTs repeated and potential further work up  F/u wound cultures   Discharge Diagnoses: Principal Problem:   foot ulcer (HCC) Active Problems:   Pre-Diabetes (HCC)   Morbid obesity (HCC)  Resolved Problems:   * No resolved hospital problems. [facility] Course: Per H&P  HPI: Gary Mercado is a 28 y.o. male with medical history significant of prediabetes, morbid obesity, who is not taking any medications. 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 The area was where his boots where damaged had a lot of water damage  Patient first noticed a crack in his toes about 3 weeks ago which has gradually gotten worsened to a ulceration of the 3rd and 4th toe of both foot with the R being worse than the left foot. He presented to the ER because of worsening pain, oozing, and a change in color of his toes.  There is concern for possible osteomyelitis after MRI of the feet was obtained. Patient was started on IV antibiotics and podiatry was consulted. Podiatry reviewed images and felt his infection did not involve the bones. It was recommended he continue antibiotics and follow up with podiatry outpatient. He was also given instructions for wound care. For other problems see below.   Assessment and Plan:  Elevated LFTs Patient with mildly elevated LFTs. He is asymptomatic from this. 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  -Patient needs this further worked up in the outpatient setting.   Microcytosis without anemia Patient will need iron levels checked and possible hgb electrophoresis out of the hospital.   Prediabetes  A1c 5.9. Will need further management outpatient.   Class  3 obesity  BMI 44 complicates care       Consultants: Podiatry  Procedures performed: none  Disposition: Home Diet recommendation:  Discharge Diet Orders (From admission, onward)     Start     Ordered   10/01/23 0000  Diet general        10/01/23 1535           Regular diet DISCHARGE MEDICATION: Allergies as of 10/01/2023   No Known Allergies      Medication List     TAKE these medications    acetaminophen  325 MG tablet Commonly known as: TYLENOL  Take 2 tablets (650 mg total) by mouth every 6 (six) hours as needed for mild pain (pain score 1-3) or fever (or Fever >/= 101).   cefadroxil  500 MG capsule Commonly known as: DURICEF Take 2 capsules (1,000 mg total) by mouth daily for 7 days.               Discharge Care Instructions  (From admission, onward)           Start     Ordered   10/01/23 0000  Discharge wound care:       Comments: Apply Betadine to affected webspaces bilaterally.  Pat dry.  Keep gauze between the toes and allowed to dry.  Perform daily.   10/01/23 1535            Follow-up Information     Ashley Soulier, DPM Follow up on 10/06/2023.   Specialty: Podiatry Why: Per office  patient to call in appointment Contact information: [address] [facility] [address] [place] [address] [phone]                Discharge Exam: Gary Mercado   09/30/23 1737  Weight: (!) 149.7 kg   Physical Exam  Constitutional: In no distress.  Cardiovascular: Normal rate, regular rhythm. No lower extremity edema  Pulmonary: Non labored breathing on room air, no wheezing or rales.   Abdominal: Soft. Non distended and non tender Musculoskeletal: Normal range of motion.     Neurological: Alert and oriented to person, place, and time. Non focal  Skin: dorsal surface of R 3rd and 4th toes with maceration, mild erythema, some discharge foul odor, L 3rd and 4th toes mild ulceration see media tab for photos.     Condition at discharge: good  The results of significant diagnostics from this hospitalization (including imaging, microbiology, ancillary and laboratory) are listed below for reference.   Imaging Studies: MR FOOT LEFT WO CONTRAST Result Date: 10/01/2023 CLINICAL DATA:  Wounds between the toes over the last 3 weeks EXAM: MRI OF THE LEFT FOOT WITHOUT CONTRAST TECHNIQUE: Multiplanar, multisequence MR imaging of the left foot from the midfoot through the toes was performed. No intravenous contrast was administered. COMPARISON:  09/30/2023 radiographs FINDINGS: Bones/Joint/Cartilage Low-level marrow edema inferiorly in the navicular noted near the attachment sites of the inferoplantar longitudinal portion of the spring ligament and near the attachment site of the plantar cuneonavicular ligament. This could reflect low-grade stress injury. Ligaments The Lisfranc ligament appears intact. Muscles and Tendons Trace edema along the myotendinous junction of the abductor hallucis muscle, muscle strain not excluded. Soft tissues Dorsal subcutaneous edema in the forefoot, cellulitis is not excluded. Cutaneous thickening and likely inflammation along the web space between the third and fourth toes. IMPRESSION: 1. Cutaneous thickening and likely inflammation along the web space between the third and fourth toes. 2. Dorsal subcutaneous edema in the forefoot, cellulitis is not excluded. 3. Low-level marrow edema inferiorly in the navicular near the attachment sites of the inferoplantar longitudinal portion of the spring ligament and near the attachment site of the plantar cuneonavicular ligament. This could reflect low-grade stress injury. 4. Trace edema along the myotendinous junction of the abductor hallucis muscle, muscle strain not excluded. Electronically Signed   By: Ryan Salvage M.D.   On: 10/01/2023 09:18   MR FOOT RIGHT WO CONTRAST Result Date: 10/01/2023 CLINICAL DATA:  Wounds between toes.  Assessment  for osteomyelitis EXAM: MRI OF THE RIGHT FOREFOOT WITHOUT CONTRAST TECHNIQUE: Multiplanar, multisequence MR imaging of the right forefoot was performed. No intravenous contrast was administered. COMPARISON:  Radiographs 09/30/2023 FINDINGS: Bones/Joint/Cartilage Abnormal edema noted proximally in the proximal phalanx of the great toe and in the medial sesamoid of the great toe. Slight irregularity of the plantar plate in this vicinity raises the possibility of turf toe, but given the clinical scenario, osteomyelitis is not excluded. Low-grade edema noted in the cuboid and lateral cuneiform, nonspecific but potentially from stress injury or arthropathy. Subtle endosteal edema at the talonavicular articulation likely secondary to arthropathy. Ligaments Lisfranc ligament intact. Muscles and Tendons Unremarkable Soft tissues Substantial abnormal cutaneous thickening/blistering between the second and third toes. Dorsal subcutaneous edema in the forefoot is nonspecific, cellulitis is not excluded. IMPRESSION: 1. Abnormal edema in the proximal phalanx of the great toe and in the medial sesamoid of the great toe. Slight irregularity of the plantar plate in this vicinity raises the possibility of turf  toe, but given the clinical scenario, osteomyelitis is not excluded. 2. Substantial abnormal cutaneous thickening/blistering between the second and third toes. 3. Dorsal subcutaneous edema in the forefoot is nonspecific, cellulitis is not excluded. 4. Low-grade edema in the cuboid and lateral cuneiform, nonspecific but potentially from stress injury or arthropathy. Electronically Signed   By: Ryan Salvage M.D.   On: 10/01/2023 08:46   DG Foot Complete Right Result Date: 09/30/2023 CLINICAL DATA:  Provided history: Maceration, infection between 3rd and 4th toes rule out an osteomyelitis, subcutaneous air EXAM: RIGHT FOOT COMPLETE - 3+ VIEW COMPARISON:  None Available. FINDINGS: No erosive or bony destructive change. No  fracture. Mild talonavicular degenerative change. Mild degenerative change of the first metatarsal phalangeal joint. Soft tissue thickening of the toes. No soft tissue gas or radiopaque foreign body. Generalized soft tissue edema of the foot. IMPRESSION: Soft tissue thickening of the toes. No radiographic findings of osteomyelitis. No soft tissue gas. Electronically Signed   By: Andrea Gasman M.D.   On: 09/30/2023 16:30   DG Foot Complete Left Result Date: 09/30/2023 CLINICAL DATA:  Provided history: Extensive purulent drainage between 4th and 5th toes, rule out osteomyelitis EXAM: LEFT FOOT - COMPLETE 3+ VIEW COMPARISON:  None Available. FINDINGS: No obvious bony destructive change. There is subtle diminished density involving the distal tuft of the fourth digit which can be seen in the setting of early osteomyelitis or asymmetry of overlapping soft tissues. No soft tissue gas or radiopaque foreign body. No fracture. Mild talonavicular spurring. Large os trigonum. Soft tissue edema seen of the forefoot, more prominently affecting the fourth toe. IMPRESSION: 1. Subtle diminished density involving the distal tuft of the fourth digit which can be seen in the setting of early osteomyelitis or asymmetry of overlapping soft tissues. No obvious bony destructive change. Consider MRI if there is clinical concern for osteomyelitis. 2. Soft tissue edema of the forefoot, more prominently affecting the fourth toe. Electronically Signed   By: Andrea Gasman M.D.   On: 09/30/2023 16:29    Microbiology: Results for orders placed or performed during the hospital encounter of 09/30/23  Culture, blood (routine x 2)     Status: None (Preliminary result)   Collection Time: 09/30/23  7:11 PM   Specimen: BLOOD  Result Value Ref Range Status   Specimen Description BLOOD RIGHT ANTECUBITAL  Final   Special Requests   Final    BOTTLES DRAWN AEROBIC AND ANAEROBIC Blood Culture results may not be optimal due to an inadequate  volume of blood received in culture bottles   Culture   Final    NO GROWTH 2 DAYS Performed at [facility], [address] [place], [place] [address]    Report Status PENDING  Incomplete  Culture, blood (routine x 2)     Status: None (Preliminary result)   Collection Time: 09/30/23  7:11 PM   Specimen: BLOOD  Result Value Ref Range Status   Specimen Description BLOOD LEFT ANTECUBITAL  Final   Special Requests   Final    BOTTLES DRAWN AEROBIC AND ANAEROBIC Blood Culture adequate volume   Culture   Final    NO GROWTH 2 DAYS Performed at [facility], [address] [place], [place] [address]    Report Status PENDING  Incomplete    Labs: CBC: Recent Labs  Lab 09/30/23 1622 09/30/23 1911 10/01/23 0945  WBC 7.2 6.7 6.6  NEUTROABS 4.8  --   --   HGB 14.6 14.1 14.6  HCT  46.7 45.7 47.9  MCV 71.3* 73.8* 73.1*  PLT 268 269 279   Basic Metabolic Panel: Recent Labs  Lab 09/30/23 1622 09/30/23 1911 10/01/23 0945  NA 133*  --  136  K 4.1  --  4.1  CL 101  --  104  CO2 25  --  26  GLUCOSE 86  --  106*  BUN 21*  --  17  CREATININE 1.16 1.23 1.11  CALCIUM 9.1  --  9.1   Liver Function Tests: Recent Labs  Lab 10/01/23 0945  AST 60*  ALT 70*  ALKPHOS 42  BILITOT 1.9*  PROT 8.5*  ALBUMIN 3.4*   CBG: Recent Labs  Lab 09/30/23 2143 10/01/23 0747  GLUCAP 107* 96    Discharge time spent: greater than 30 minutes.  Signed: Alban Pepper, MD [facility] 10/02/2023

## 2023-10-02 NOTE — Progress Notes (Signed)
 Amb ref for primary care physician placed.   Saw patient's cultures from urgent care and sent 7d course of doxycycline  as he is currently on cefadroxil  only. 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   We discussed that he should call Dr. Layla office to get an appointment next Monday. He is also going to call [facility] for a primary care appointment.   We discussed his microcytosis and how that might mean he has iron deficiency or another problem with his red blood cells.   We talked about his elevated liver enzymes and how FLD might be contributing. He also mentioned that he takes testosterone. We discussed holding this medicine. Patient fortunately has a nutrition/dietitian team he sees called [facility] and will relay this information to them as well.

## 2023-10-03 ENCOUNTER — Ambulatory Visit: Payer: Self-pay

## 2023-10-03 MED ORDER — DOXYCYCLINE HYCLATE 100 MG PO TABS: 100.0000 mg | ORAL_TABLET | Freq: Two times a day (BID) | ORAL | 0 refills | Status: AC

## 2023-10-03 NOTE — Addendum Note (Signed)
 Addended by: FRANCHOT ALBAN CROME on: 10/03/2023 03:30 PM   Modules accepted: Orders

## 2023-10-03 NOTE — Hospital Course (Signed)
 Patient also takes testosterone this could also be contributing to his elevated liver enzymes.   He is going to schedule an appointment with primary care at [facility] in [place] as well as podistry   Also will prescribe doxycycline 

## 2023-10-05 LAB — CULTURE, BLOOD (ROUTINE X 2)
Culture: NO GROWTH
Culture: NO GROWTH
Special Requests: ADEQUATE

## 2023-10-09 LAB — SUSCEPTIBILITY, AER + ANAEROB

## 2023-10-09 LAB — SUSCEPTIBILITY RESULT

## 2023-10-12 LAB — AEROBIC CULTURE W GRAM STAIN (SUPERFICIAL SPECIMEN)

## 2023-11-22 ENCOUNTER — Ambulatory Visit
Admission: EM | Admit: 2023-11-22 | Discharge: 2023-11-22 | Disposition: A | Discharge status: Home / Self Care | Attending: Emergency Medicine | Admitting: Emergency Medicine

## 2023-11-22 ENCOUNTER — Encounter: Payer: Self-pay | Admitting: Emergency Medicine

## 2023-11-22 DIAGNOSIS — Z113 Encounter for screening for infections with a predominantly sexual mode of transmission: Secondary | ICD-10-CM | POA: Diagnosis present

## 2023-11-22 DIAGNOSIS — M25579 Pain in unspecified ankle and joints of unspecified foot: Secondary | ICD-10-CM | POA: Diagnosis present

## 2023-11-22 LAB — HIV ANTIBODY (ROUTINE TESTING W REFLEX): HIV Screen 4th Generation wRfx: NONREACTIVE

## 2023-11-22 MED ORDER — IBUPROFEN 600 MG PO TABS: 600.0000 mg | ORAL_TABLET | Freq: Four times a day (QID) | ORAL | 0 refills | Status: AC | PRN

## 2023-11-22 NOTE — ED Triage Notes (Signed)
 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 Pt states he had looked it up and believes this may be what is causing his ankle inflammation. No other symptoms.

## 2023-11-22 NOTE — ED Provider Notes (Signed)
 [facility]    CSN: [id number] Arrival date & time: 11/22/23  1355      History   Chief Complaint Chief Complaint  Patient presents with   Exposure to STD    HPI Gary Mercado is a 28 y.o. male.   HPI  28 year old male with past medical history significant for morbid obesity and prediabetes presents with request for blood work.  He recently had blood drawn and showed up reactive for RPR.  He was sent in letter from the testing company advising him to have further testing.  He reports that he is having bilateral ankle pain but he denies any rashes or lesions on his genitals or body rash.  Past Medical History:  Diagnosis Date   Obesity     Patient Active Problem List   Diagnosis Date Noted   foot ulcer (HCC) 09/30/2023   Pre-Diabetes (HCC) 09/30/2023   Morbid obesity (HCC) 09/30/2023    Past Surgical History:  Procedure Laterality Date   KNEE SURGERY Right        Home Medications    Prior to Admission medications   Medication Sig Start Date End Date Taking? Authorizing Provider  ibuprofen  (ADVIL ) 600 MG tablet Take 1 tablet (600 mg total) by mouth every 6 (six) hours as needed. 11/22/23  Yes Bernardino Ditch, NP  acetaminophen  (TYLENOL ) 325 MG tablet Take 2 tablets (650 mg total) by mouth every 6 (six) hours as needed for mild pain (pain score 1-3) or fever (or Fever >/= 101). 10/01/23   Franchot Novel, MD    Family History Family History  Problem Relation Age of Onset   Healthy Mother    Healthy Father     Social History Social History   Tobacco Use   Smoking status: Never   Smokeless tobacco: Never  Vaping Use   Vaping status: Never Used  Substance Use Topics   Alcohol use: Yes    Comment: social   Drug use: Never     Allergies   Patient has no known allergies.   Review of Systems Review of Systems  Musculoskeletal:  Positive for arthralgias.  Skin:  Negative for color change and rash.     Physical Exam Triage Vital  Signs ED Triage Vitals  Encounter Vitals Group     BP      Girls Systolic BP Percentile      Girls Diastolic BP Percentile      Boys Systolic BP Percentile      Boys Diastolic BP Percentile      Pulse      Resp      Temp      Temp src      SpO2      Weight      Height      Head Circumference      Peak Flow      Pain Score      Pain Loc      Pain Education      Exclude from Growth Chart    No data found.  Updated Vital Signs BP 130/75 (BP Location: Left Arm)   Pulse 70   Temp 98.5 F (36.9 C) (Oral)   Resp 16   Ht 6' (1.829 m)   Wt (!) 325 lb 6.4 oz (147.6 kg)   SpO2 99%   BMI 44.13 kg/m   Visual Acuity Right Eye Distance:   Left Eye Distance:   Bilateral Distance:    Right Eye Near:   Left  Eye Near:    Bilateral Near:     Physical Exam Vitals and nursing note reviewed.  Constitutional:      Appearance: Normal appearance. He is not ill-appearing.  HENT:     Head: Normocephalic and atraumatic.  Skin:    General: Skin is warm and dry.     Capillary Refill: Capillary refill takes less than 2 seconds.     Findings: No rash.  Neurological:     General: No focal deficit present.     Mental Status: He is alert and oriented to person, place, and time.      UC Treatments / Results  Labs (all labs ordered are listed, but only abnormal results are displayed) Labs Reviewed  RPR  HIV ANTIBODY (ROUTINE TESTING W REFLEX)    EKG   Radiology No results found.  Procedures Procedures (including critical care time)  Medications Ordered in UC Medications - No data to display  Initial Impression / Assessment and Plan / UC Course  I have reviewed the triage vital signs and the nursing notes.  Pertinent labs & imaging results that were available during my care of the patient were reviewed by me and considered in my medical decision making (see chart for details).   Patient is a nontoxic-appearing 28 year old male presenting with request for blood work  after receiving a reactive RPR from the previous blood draw.  The confirmatory testing was not performed.  He has his results with him which shows that he did have an HIV test that was nonreactive.  I advised him that we need to redraw his blood here to confirm that he is reactive to an RPR and then have his secondary confirmatory testing performed to determine if this is a true reactive or a false reactive.  He is also requesting medication for his ankle pain.  I advised him I be happy to write him for some ibuprofen  for his pain but I would not be inclined to give him steroids at this time until after we have his RPR testing back.  RPR is reactive with an RPR titer of 1:8, confirmatory T palladium antibody testing in process.  HIV screen is nonreactive.  Confirmatory testing of T. pallidum antibody is also reactive.  Prescription for doxycycline  sent to the pharmacy for 28 days.   Final Clinical Impressions(s) / UC Diagnoses   Final diagnoses:  Screen for STD (sexually transmitted disease)  Pain in joint involving ankle and foot, unspecified laterality     Discharge Instructions      We have redrawn your RPR test to determine if you had a false reactive or a true reactive.  If the test again is reactive they will do confirmatory secondary testing.  If you test positive you will be contacted by phone and treatment options will be provided.  If your results are negative they will appear in your MyChart.  For your ankle pain please take 600 mg of ibuprofen  every 6 hours with food as needed for pain.     ED Prescriptions     Medication Sig Dispense Auth. Provider   ibuprofen  (ADVIL ) 600 MG tablet Take 1 tablet (600 mg total) by mouth every 6 (six) hours as needed. 30 tablet Bernardino Ditch, NP      PDMP not reviewed this encounter.   Bernardino Ditch, NP 11/22/23 1437    Bernardino Ditch, NP 11/24/23 9245    Bernardino Ditch, NP 11/28/23 [phone]

## 2023-11-22 NOTE — Discharge Instructions (Addendum)
 We have redrawn your RPR test to determine if you had a false reactive or a true reactive.  If the test again is reactive they will do confirmatory secondary testing.  If you test positive you will be contacted by phone and treatment options will be provided.  If your results are negative they will appear in your MyChart.  For your ankle pain please take 600 mg of ibuprofen  every 6 hours with food as needed for pain.

## 2023-11-23 LAB — RPR
RPR Ser Ql: REACTIVE — AB
RPR Titer: 1:8 {titer}

## 2023-11-24 ENCOUNTER — Ambulatory Visit (HOSPITAL_COMMUNITY): Payer: Self-pay

## 2023-11-25 LAB — T.PALLIDUM AB, TOTAL: T Pallidum Abs: REACTIVE — AB

## 2023-11-27 MED ORDER — DOXYCYCLINE HYCLATE 100 MG PO TABS: 100.0000 mg | ORAL_TABLET | Freq: Two times a day (BID) | ORAL | 0 refills | Status: AC

## 2023-11-27 MED ORDER — DOXYCYCLINE HYCLATE 100 MG PO TABS: 100.0000 mg | ORAL_TABLET | Freq: Two times a day (BID) | ORAL | 0 refills | Status: DC
